# Patient Record
Sex: Male | Born: 1985 | Race: Black or African American | Hispanic: No | Marital: Single | Smoking: Current every day smoker
Health system: Southern US, Community
[De-identification: ages and names within clinical notes are randomized; demographics above are authoritative.]

---

## 2016-06-01 ENCOUNTER — Encounter: Payer: Self-pay | Admitting: *Deleted

## 2016-06-01 ENCOUNTER — Emergency Department: Payer: Medicaid - Out of State

## 2016-06-01 ENCOUNTER — Emergency Department
Admission: EM | Admit: 2016-06-01 | Discharge: 2016-06-01 | Disposition: A | Discharge status: Home / Self Care | Payer: Medicaid - Out of State | Attending: Emergency Medicine | Admitting: Emergency Medicine

## 2016-06-01 DIAGNOSIS — F17200 Nicotine dependence, unspecified, uncomplicated: Secondary | ICD-10-CM | POA: Diagnosis not present

## 2016-06-01 DIAGNOSIS — R0789 Other chest pain: Secondary | ICD-10-CM | POA: Diagnosis not present

## 2016-06-01 LAB — BASIC METABOLIC PANEL
ANION GAP: 8 (ref 5–15)
BUN: 10 mg/dL (ref 6–20)
CHLORIDE: 105 mmol/L (ref 101–111)
CO2: 27 mmol/L (ref 22–32)
Calcium: 9.1 mg/dL (ref 8.9–10.3)
Creatinine, Ser: 0.93 mg/dL (ref 0.61–1.24)
GFR calc non Af Amer: >60 mL/min (ref >60)
Glucose, Bld: 87 mg/dL (ref 65–99)
POTASSIUM: 3.6 mmol/L (ref 3.5–5.1)
SODIUM: 140 mmol/L (ref 135–145)

## 2016-06-01 LAB — TROPONIN I
Troponin I: <0.03 ng/mL (ref <0.03)
Troponin I: <0.03 ng/mL (ref <0.03)

## 2016-06-01 LAB — CBC
HEMATOCRIT: 39.1 % — AB (ref 40.0–52.0)
Hemoglobin: 13.6 g/dL (ref 13.0–18.0)
MCH: 29.4 pg (ref 26.0–34.0)
MCHC: 34.7 g/dL (ref 32.0–36.0)
MCV: 84.5 fL (ref 80.0–100.0)
Platelets: 360 10*3/uL (ref 150–440)
RBC: 4.63 MIL/uL (ref 4.40–5.90)
RDW: 14.2 % (ref 11.5–14.5)
WBC: 8.9 10*3/uL (ref 3.8–10.6)

## 2016-06-01 MED ORDER — ONDANSETRON 4 MG PO TBDP
8.0000 mg | ORAL_TABLET | Freq: Once | ORAL | Status: AC
  Administered 2016-06-01: 8 mg via ORAL

## 2016-06-01 MED ORDER — ONDANSETRON 8 MG PO TBDP
ORAL_TABLET | ORAL | Status: AC
  Administered 2016-06-01: 8 mg via ORAL
  Filled 2016-06-01: qty 1

## 2016-06-01 MED ORDER — ASPIRIN 81 MG PO CHEW
324.0000 mg | CHEWABLE_TABLET | Freq: Once | ORAL | Status: AC
  Administered 2016-06-01: 324 mg via ORAL
  Filled 2016-06-01: qty 4

## 2016-06-01 NOTE — ED Triage Notes (Signed)
Pt came from work with chest pain.  Pain started this morning and got worse this evening while at work.  Reports pressure in left chest.  cig smoker.  No cough  Pt alert.

## 2016-06-01 NOTE — Discharge Instructions (Signed)

## 2016-06-01 NOTE — ED Provider Notes (Signed)
[facility] Emergency Department Provider Note  ____________________________________________   First MD Initiated Contact with Patient 06/01/16 2259     (approximate)  I have reviewed the triage vital signs and the nursing notes.   HISTORY  Chief Complaint Chest Pain    HPI Bruce Lowe is a 31 y.o. male who is generally healthy and of an athletic body habitus who presents for evaluation of acute onset of chest pressure.  He reports that the sensation started this morning.  He reports that the discomfort got worse over the course of the day while he was at work.  He has a job that requires him to do a lot of lifting and straining and he found that reaching for things and lifting things made the discomfort worse.  He feels like the sensation is also worse when he tries taking a deep breath.  He denies any sharp or stabbing pain in the sensation does not know through to his back.  He describes the incision as severe.  Nothing in particular makes it better.  He denies fever/chills, nausea, vomiting, shortness of breath except for the discomfort with deep breaths, abdominal pain, dysuria.  He has had some cough recently.  He has had no surgeries or immobilizations.  He has no history of cancer.  He has no history of blood clots in the legs or lungs.  He has a history of childhood asthma but has not undergone treatment for a number of years.  He reports that he thinks his mother has had problems with her heart but he is not sure what exactly she had.  He is not specifically aware of anyone having had heart attacks.  No past medical history on file.  There are no active problems to display for this patient.   No past surgical history on file.  Prior to Admission medications   Not on File    Allergies Patient has no known allergies.  No family history on file.  Social History Social History  Substance Use Topics  . Smoking status: Current Every Day  Smoker  . Smokeless tobacco: Never Used  . Alcohol use No    Review of Systems Constitutional: No fever/chills Eyes: No visual changes. ENT: No sore throat. Cardiovascular: chest pressure throughout anterior chest wall Respiratory: Denies shortness of breath. Gastrointestinal: No abdominal pain.  No nausea, no vomiting.  No diarrhea.  No constipation. Genitourinary: Negative for dysuria. Musculoskeletal: Negative for back pain. Integumentary: Negative for rash. Neurological: Negative for headaches, focal weakness or numbness.   ____________________________________________   PHYSICAL EXAM:  VITAL SIGNS: ED Triage Vitals  Enc Vitals Group     BP 06/01/16 1914 131/64     Pulse Rate 06/01/16 1914 (!) 104     Resp 06/01/16 1914 20     Temp 06/01/16 1914 98.6 F (37 C)     Temp Source 06/01/16 1914 Oral     SpO2 06/01/16 1914 100 %     Weight 06/01/16 1915 180 lb (81.6 kg)     Height 06/01/16 1915 6' (1.829 m)     Head Circumference --      Peak Flow --      Pain Score 06/01/16 1914 8     Pain Loc --      Pain Edu? --      Excl. in GC? --     Constitutional: Alert and oriented. Well appearing and in no acute distress. Athletic and non-marfanoid body habitus. Eyes: Conjunctivae are  normal. PERRL. EOMI. Head: Atraumatic. Nose: No congestion/rhinnorhea. Mouth/Throat: Mucous membranes are moist. Neck: No stridor.  No meningeal signs.   Cardiovascular: Normal rate, regular rhythm. Good peripheral circulation. Grossly normal heart sounds.  Chest wall tenderness to palpation throughout the anterior torso. Respiratory: Normal respiratory effort.  No retractions. Lungs CTAB. Gastrointestinal: Soft and nontender. No distention.  Musculoskeletal: No lower extremity tenderness nor edema. No gross deformities of extremities. Neurologic:  Normal speech and language. No gross focal neurologic deficits are appreciated.  Skin:  Skin is warm, dry and intact. No rash noted. Psychiatric:  Mood and affect are normal. Speech and behavior are normal.  ____________________________________________   LABS (all labs ordered are listed, but only abnormal results are displayed)  Labs Reviewed  CBC - Abnormal; Notable for the following:       Result Value   HCT 39.1 (*)    All other components within normal limits  BASIC METABOLIC PANEL  TROPONIN I  TROPONIN I   ____________________________________________  EKG  ED ECG REPORT I, Esha Fincher, the attending physician, personally viewed and interpreted this ECG.  Date: 06/01/2016 EKG Time: 19:09 Rate: 97 Rhythm: normal sinus rhythm QRS Axis: normal Intervals: normal ST/T Wave abnormalities: inverted T waves in lead III, V4, V5, otherwise unremarkable Conduction Disturbances: none Narrative Interpretation: no evidence of acute ischemia  ____________________________________________  RADIOLOGY   Dg Chest 2 View  Result Date: 06/01/2016 CLINICAL DATA:  Chest pain and vomiting. Pressure in the left chest. Smoker. EXAM: CHEST  2 VIEW COMPARISON:  None. FINDINGS: The heart size and mediastinal contours are within normal limits. Both lungs are clear. The visualized skeletal structures are unremarkable. IMPRESSION: No active cardiopulmonary disease. Electronically Signed   By: Burman Nieves M.D.   On: 06/01/2016 21:52    ____________________________________________   PROCEDURES  Critical Care performed: No   Procedure(s) performed:   Procedures   ____________________________________________   INITIAL IMPRESSION / ASSESSMENT AND PLAN / ED COURSE  Pertinent labs & imaging results that were available during my care of the patient were reviewed by me and considered in my medical decision making (see chart for details).  HEART score 1-2, PERC negative.  He is healthy appearing and in no acute distress with a completely normal workup including 2 negative troponins.  He has some nonspecific T-wave inversions in  lead 3 and V4-V5 but without any evidence of acute ischemia.  He has highly reproducible chest wall tenderness and a job that requires a lot of lifting and straining.  I think musculoskeletal strain versus Oster chondritis is most likely.  There is no evidence of any acute or emergent medical conditions at this time.  Aortic dissection would be extremely unlikely based on the patient's presentation and symptoms.  I had a 15 minute discussion with the patient and his father regarding his symptoms, his workup, my differential diagnosis, and my recommendations which include ibuprofen 600 mg 3 times a day with meals for no more than a week and outpatient follow-up including both with primary care and a cardiologist if he prefers.  I gave my usual and customary return precautions.  He understands and agrees with the plan.  I am giving a full dose aspirin prior to discharge.      ____________________________________________  FINAL CLINICAL IMPRESSION(S) / ED DIAGNOSES  Final diagnoses:  Atypical chest pain     MEDICATIONS GIVEN DURING THIS VISIT:  Medications  ondansetron (ZOFRAN-ODT) disintegrating tablet 8 mg (8 mg Oral Given 06/01/16 1959)  aspirin  chewable tablet 324 mg (324 mg Oral Given 06/01/16 2320)     NEW OUTPATIENT MEDICATIONS STARTED DURING THIS VISIT:  There are no discharge medications for this patient.   There are no discharge medications for this patient.   There are no discharge medications for this patient.    Note:  This document was prepared using Dragon voice recognition software and may include unintentional dictation errors.    Loleta Rose, MD 06/02/16 [phone]

## 2018-04-19 IMAGING — CR DG CHEST 2V
2 series · 2 of 2 positions shown · non-contrast
Comparison: None.

CLINICAL DATA: Chest pain and vomiting. Pressure in the left chest.
Smoker.

EXAM:
CHEST  2 VIEW

[chest pa]
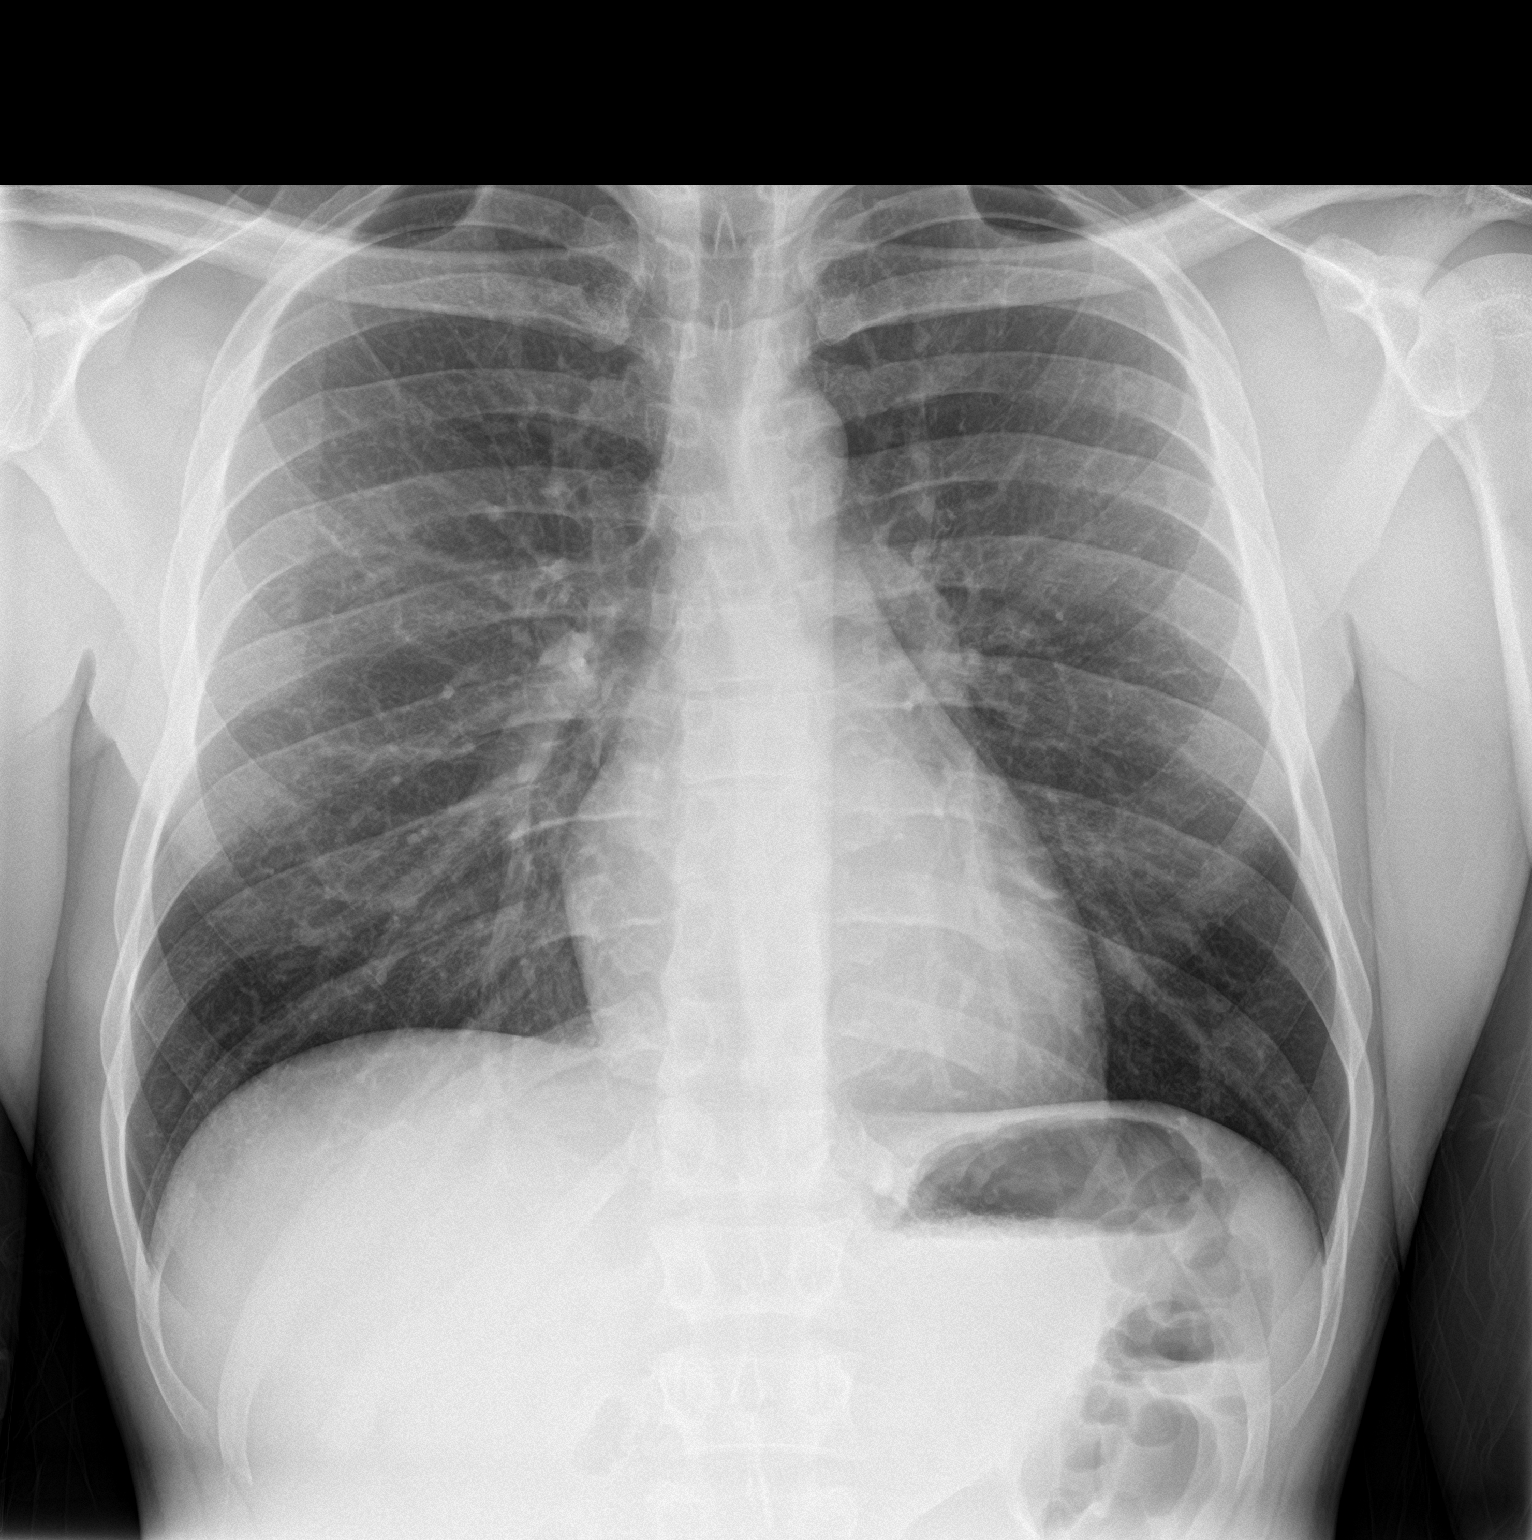

[chest lat]
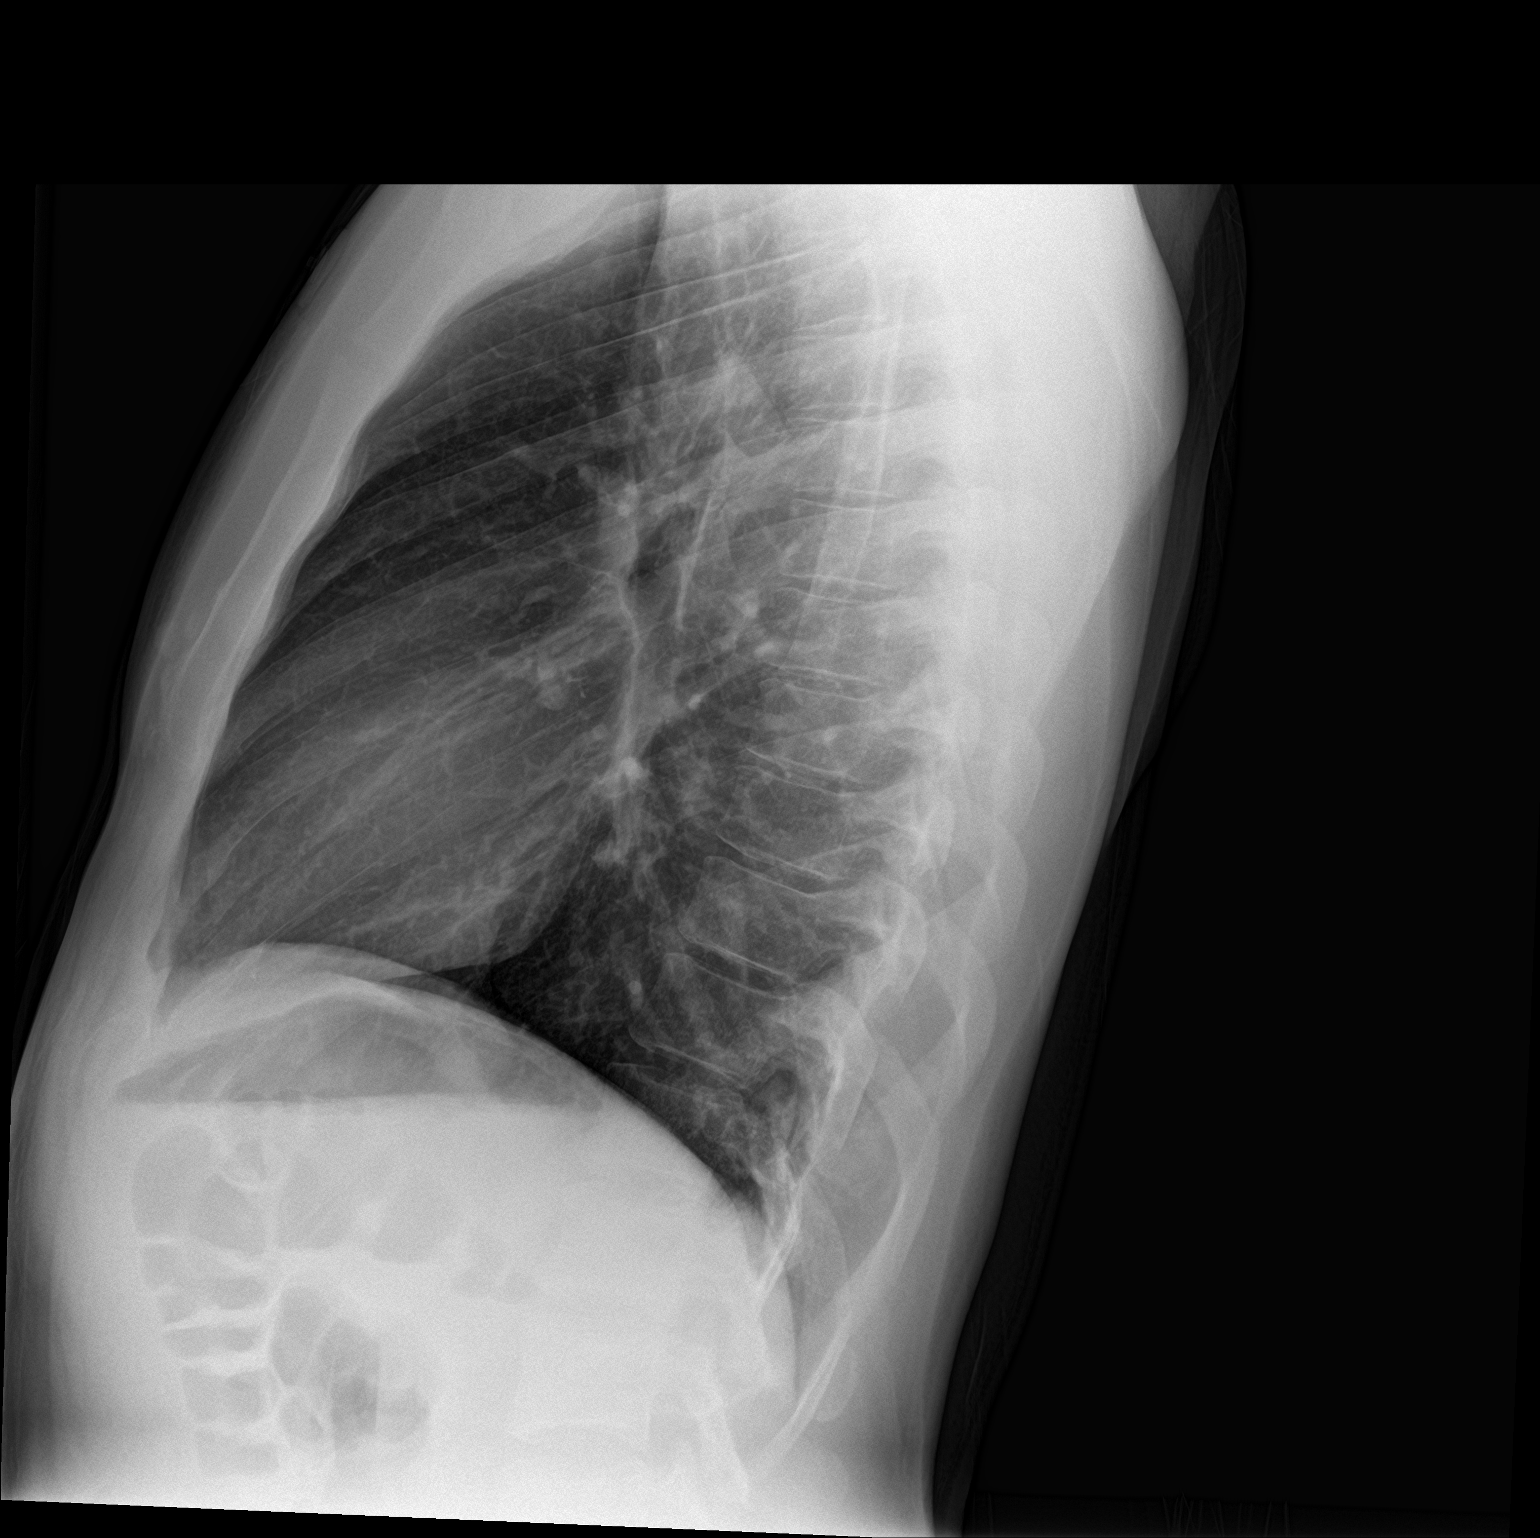

[2 of 2 positions shown; findings below may reference images not displayed]

FINDINGS: The heart size and mediastinal contours are within normal limits.
Both lungs are clear. The visualized skeletal structures are
unremarkable.
IMPRESSION: No active cardiopulmonary disease.
